# Patient Record
Sex: Male | Born: 2005 | Race: White | Hispanic: No | Marital: Single
Health system: Southern US, Community
[De-identification: ages and names within clinical notes are randomized; demographics above are authoritative.]

---
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
## 2024-02-09 ENCOUNTER — Encounter: Payer: Self-pay | Admitting: Sports Medicine
